# Patient Record
Sex: Female | Born: 1975 | Race: Black or African American | Hispanic: No | Marital: Single
Health system: Southern US, Community
[De-identification: ages and names within clinical notes are randomized; demographics above are authoritative.]

---

## 2015-03-19 ENCOUNTER — Emergency Department (HOSPITAL_COMMUNITY): Payer: Self-pay

## 2015-03-19 ENCOUNTER — Emergency Department (HOSPITAL_COMMUNITY)
Admission: EM | Admit: 2015-03-19 | Discharge: 2015-03-19 | Disposition: A | Discharge status: Home / Self Care | Payer: Self-pay | Attending: Emergency Medicine | Admitting: Emergency Medicine

## 2015-03-19 ENCOUNTER — Encounter (HOSPITAL_COMMUNITY): Payer: Self-pay | Admitting: Radiology

## 2015-03-19 DIAGNOSIS — M79605 Pain in left leg: Secondary | ICD-10-CM | POA: Insufficient documentation

## 2015-03-19 DIAGNOSIS — R569 Unspecified convulsions: Secondary | ICD-10-CM | POA: Insufficient documentation

## 2015-03-19 DIAGNOSIS — R112 Nausea with vomiting, unspecified: Secondary | ICD-10-CM | POA: Insufficient documentation

## 2015-03-19 DIAGNOSIS — Z3202 Encounter for pregnancy test, result negative: Secondary | ICD-10-CM | POA: Insufficient documentation

## 2015-03-19 DIAGNOSIS — R42 Dizziness and giddiness: Secondary | ICD-10-CM | POA: Insufficient documentation

## 2015-03-19 LAB — CBC WITH DIFFERENTIAL/PLATELET
BASOS ABS: 0 10*3/uL (ref 0.0–0.1)
BASOS PCT: 0 %
Eosinophils Absolute: 0.1 10*3/uL (ref 0.0–0.7)
Eosinophils Relative: 1 %
HEMATOCRIT: 37.7 % (ref 36.0–46.0)
HEMOGLOBIN: 12 g/dL (ref 12.0–15.0)
Lymphocytes Relative: 23 %
Lymphs Abs: 2.4 10*3/uL (ref 0.7–4.0)
MCH: 26.3 pg (ref 26.0–34.0)
MCHC: 31.8 g/dL (ref 30.0–36.0)
MCV: 82.5 fL (ref 78.0–100.0)
MONOS PCT: 6 %
Monocytes Absolute: 0.6 10*3/uL (ref 0.1–1.0)
NEUTROS ABS: 7.3 10*3/uL (ref 1.7–7.7)
NEUTROS PCT: 70 %
Platelets: 293 10*3/uL (ref 150–400)
RBC: 4.57 MIL/uL (ref 3.87–5.11)
RDW: 16.7 % — ABNORMAL HIGH (ref 11.5–15.5)
WBC: 10.4 10*3/uL (ref 4.0–10.5)

## 2015-03-19 LAB — RAPID URINE DRUG SCREEN, HOSP PERFORMED
Amphetamines: NOT DETECTED
BARBITURATES: NOT DETECTED
Benzodiazepines: NOT DETECTED
Cocaine: NOT DETECTED
Opiates: NOT DETECTED
Tetrahydrocannabinol: POSITIVE — AB

## 2015-03-19 LAB — COMPREHENSIVE METABOLIC PANEL
ALBUMIN: 4.2 g/dL (ref 3.5–5.0)
ALK PHOS: 70 U/L (ref 38–126)
ALT: 14 U/L (ref 14–54)
AST: 40 U/L (ref 15–41)
Anion gap: 14 (ref 5–15)
BILIRUBIN TOTAL: 1.5 mg/dL — AB (ref 0.3–1.2)
BUN: 10 mg/dL (ref 6–20)
CALCIUM: 9.4 mg/dL (ref 8.9–10.3)
CO2: 21 mmol/L — ABNORMAL LOW (ref 22–32)
Chloride: 104 mmol/L (ref 101–111)
Creatinine, Ser: 0.94 mg/dL (ref 0.44–1.00)
GFR calc Af Amer: >60 mL/min (ref >60)
GFR calc non Af Amer: >60 mL/min (ref >60)
GLUCOSE: 78 mg/dL (ref 65–99)
Potassium: 4.8 mmol/L (ref 3.5–5.1)
Sodium: 139 mmol/L (ref 135–145)
TOTAL PROTEIN: 8.2 g/dL — AB (ref 6.5–8.1)

## 2015-03-19 LAB — PREGNANCY, URINE: Preg Test, Ur: NEGATIVE

## 2015-03-19 LAB — URINE MICROSCOPIC-ADD ON

## 2015-03-19 LAB — I-STAT CG4 LACTIC ACID, ED: LACTIC ACID, VENOUS: 0.93 mmol/L (ref 0.5–2.0)

## 2015-03-19 LAB — D-DIMER, QUANTITATIVE (NOT AT ARMC): D DIMER QUANT: 0.59 ug{FEU}/mL — AB (ref 0.00–0.50)

## 2015-03-19 LAB — URINALYSIS, ROUTINE W REFLEX MICROSCOPIC
Bilirubin Urine: NEGATIVE
Glucose, UA: NEGATIVE mg/dL
KETONES UR: 40 mg/dL — AB
LEUKOCYTES UA: NEGATIVE
Nitrite: NEGATIVE
PROTEIN: NEGATIVE mg/dL
Specific Gravity, Urine: 1.023 (ref 1.005–1.030)
pH: 5.5 (ref 5.0–8.0)

## 2015-03-19 MED ORDER — IOHEXOL 350 MG/ML SOLN
80.0000 mL | Freq: Once | INTRAVENOUS | Status: AC | PRN
  Administered 2015-03-19: 100 mL via INTRAVENOUS

## 2015-03-19 MED ORDER — ACETAMINOPHEN 500 MG PO TABS
1000.0000 mg | ORAL_TABLET | Freq: Once | ORAL | Status: AC
  Administered 2015-03-19: 1000 mg via ORAL
  Filled 2015-03-19: qty 2

## 2015-03-19 NOTE — ED Notes (Signed)
Interpreter called: arrival estimate 20-25 minutes.

## 2015-03-19 NOTE — ED Notes (Signed)
Called for Sign Language Interpreter 

## 2015-03-19 NOTE — Discharge Instructions (Signed)
Do not drive until you are cleared by your primary care physician or neurologist in [place].    Please eat regularly, push fluids drinking water, get rest and follow closely with primary care.   Seizure, Adult A seizure means there is unusual activity in the brain. A seizure can cause changes in attention or behavior. Seizures often cause shaking (convulsions). Seizures often last from 30 seconds to 2 minutes. HOME CARE   If you are given medicines, take them exactly as told by your doctor.  Keep all doctor visits as told.  Do not swim or drive until your doctor says it is okay.  Teach others what to do if you have a seizure. They should:  Lay you on the ground.  Put a cushion under your head.  Loosen any tight clothing around your neck.  Turn you on your side.  Stay with you until you get better. GET HELP RIGHT AWAY IF:   The seizure lasts longer than 2 to 5 minutes.  The seizure is very bad.  The person does not wake up after the seizure.  The person's attention or behavior changes. Drive the person to the emergency room or call your local emergency services (911 in [place] MAKE SURE YOU:   Understand these instructions.  Will watch your condition.  Will get help right away if you are not doing well or get worse.   This information is not intended to replace advice given to you by your health care provider. Make sure you discuss any questions you have with your health care provider.   Document Released: 06/12/2007 Document Revised: 03/18/2011 Document Reviewed: 08/05/2012 Elsevier Interactive Patient Education [employer]2016 [employer]

## 2015-03-19 NOTE — ED Notes (Signed)
Patient transported to CT 

## 2015-03-19 NOTE — ED Notes (Signed)
Interpreter service called for help with interpretation for Mrs Valerie Patel, pt worry about no having interpreter service at all the time while in the hospital.
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
## 2015-03-19 NOTE — ED Notes (Signed)
Through written communication patient reports history of one seizure. Not on medication. Gives permission to obtain blood for labwork.
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
## 2015-03-19 NOTE — ED Notes (Signed)
Interpreter service to be available in 30-45 min.

## 2017-06-01 IMAGING — DX DG FOOT COMPLETE 3+V*L*
3 series · 3 of 3 positions shown · non-contrast
Comparison: None.

CLINICAL DATA: Pain of unknown origin and distal left calf.

EXAM:
LEFT FOOT - COMPLETE 3+ VIEW

[foot ap]
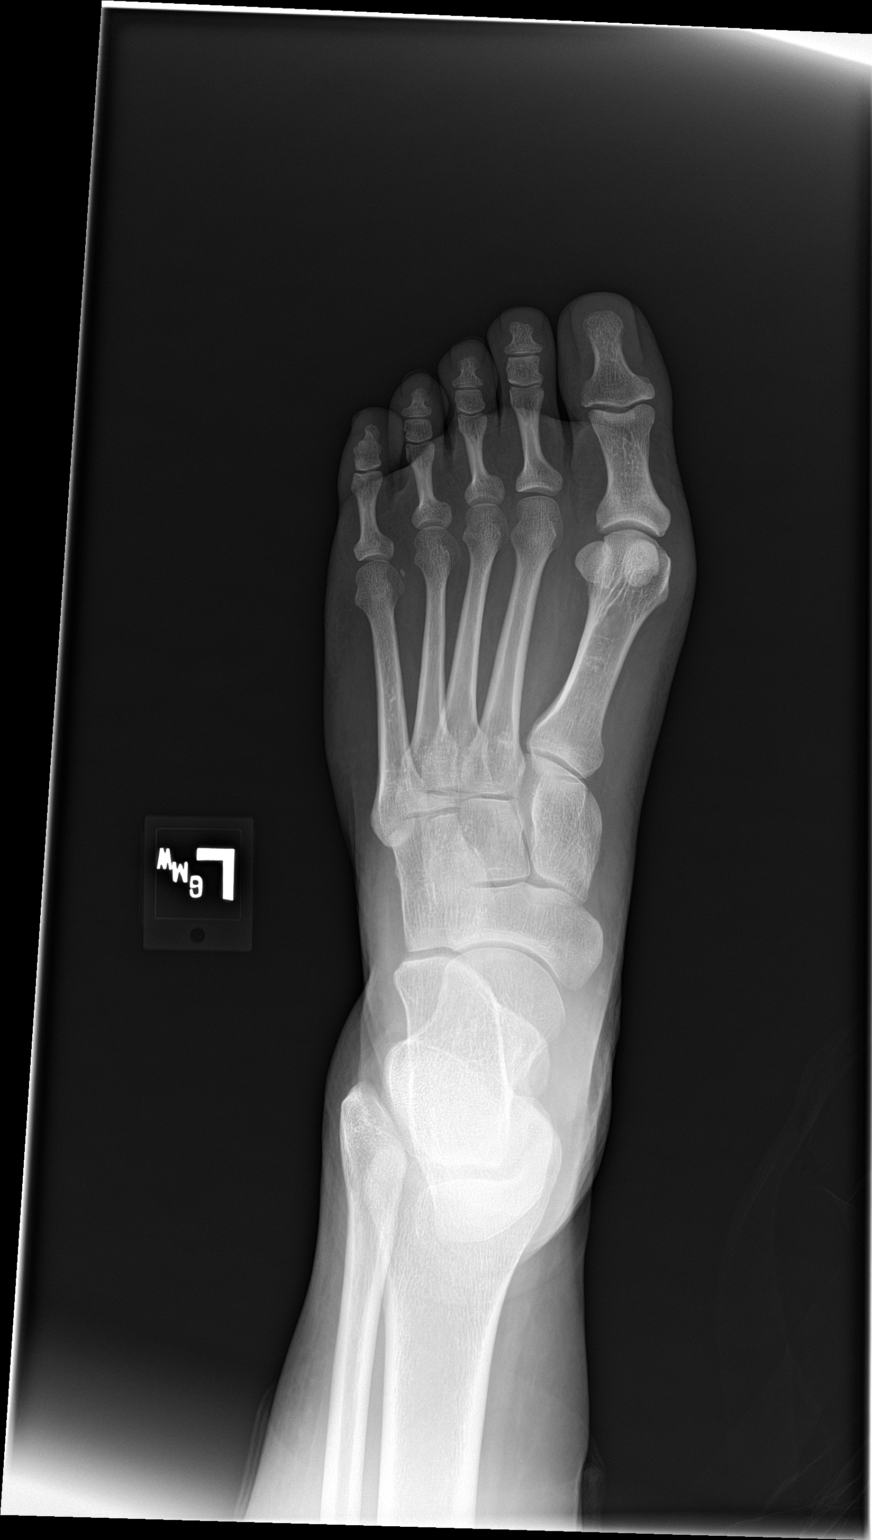

[foot obl]
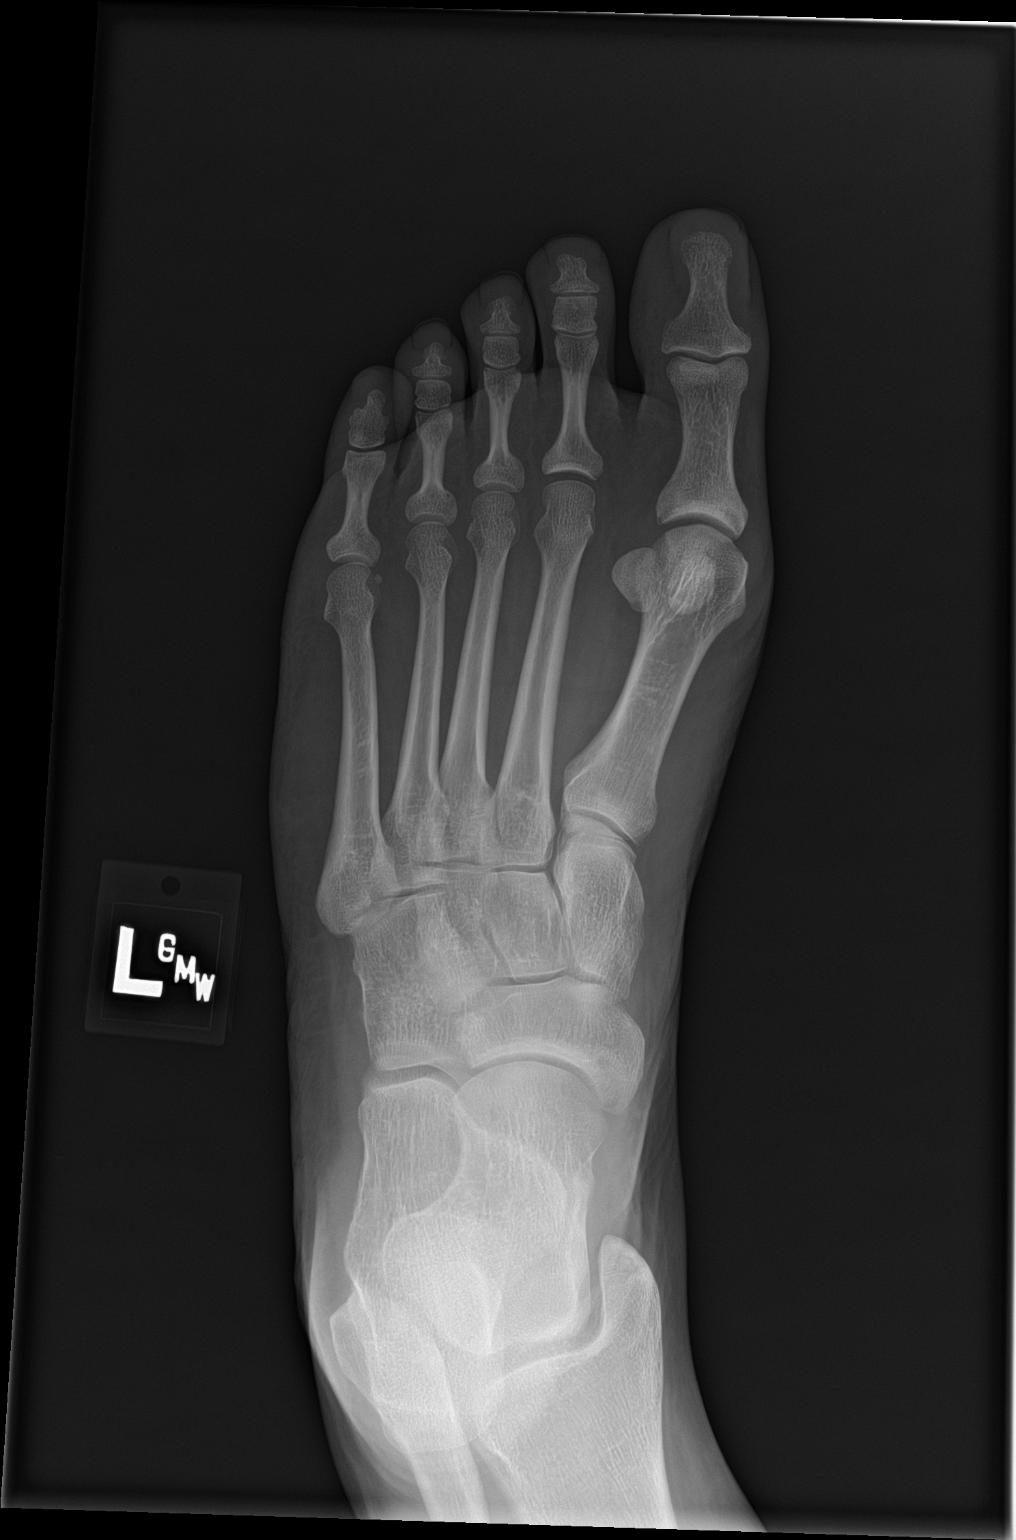

[foot lat]
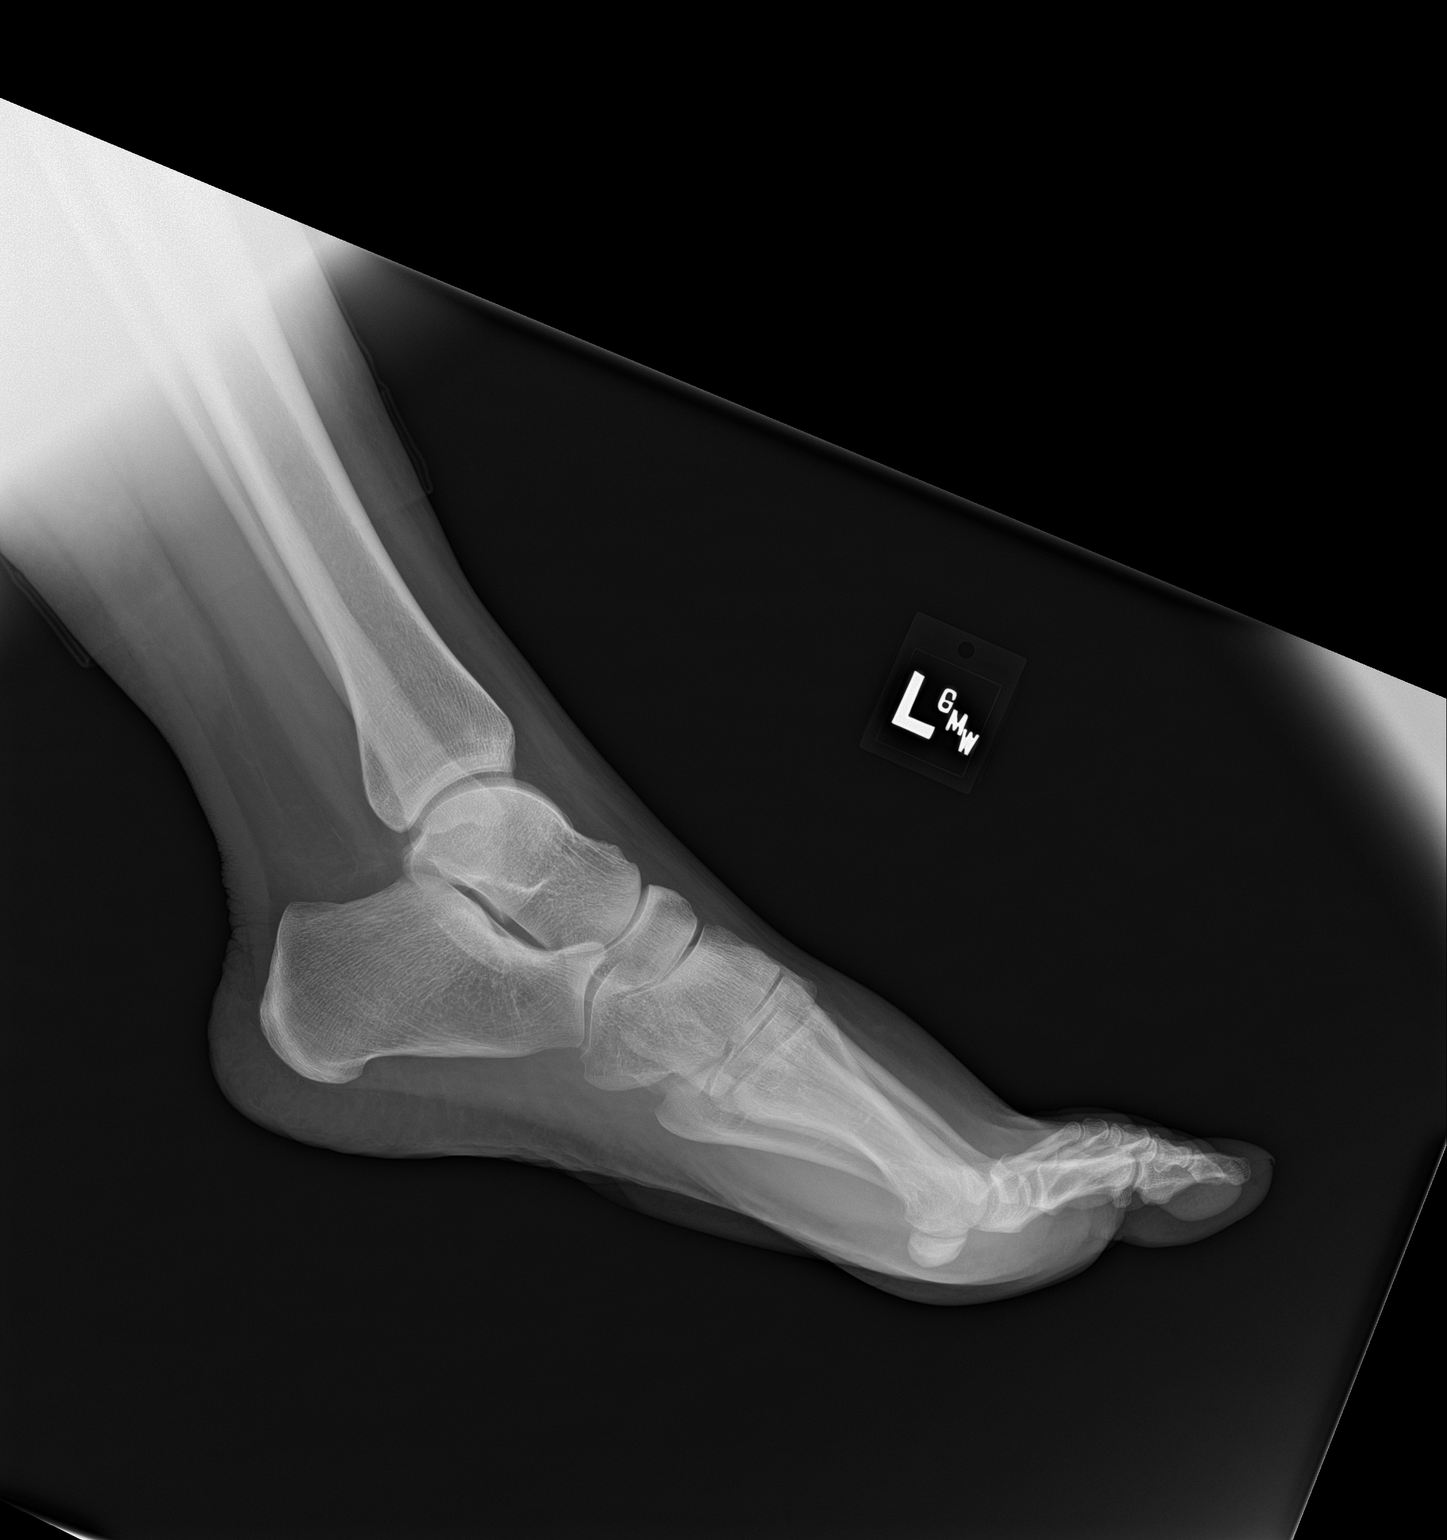

[3 of 3 positions shown; findings below may reference images not displayed]

FINDINGS: There is no evidence of fracture or dislocation. There is no
evidence of arthropathy or other focal bone abnormality. Soft
tissues are unremarkable.
IMPRESSION: Negative.

## 2017-06-01 IMAGING — CT CT ANGIO CHEST
2 of 10 series · 18 of 46 positions shown · IV contrast (APPLIED)
Comparison: None.

CLINICAL DATA: Status post fall in bathroom. Patient shaking.
Question of seizure. Shortness of breath and generalized chest pain,
acute onset. Initial encounter.

EXAM:
CT ANGIOGRAPHY CHEST WITH CONTRAST
TECHNIQUE: Multidetector CT imaging of the chest was performed using the
standard protocol during bolus administration of intravenous
contrast. Multiplanar CT image reconstructions and MIPs were
obtained to evaluate the vascular anatomy.
CONTRAST:  100mL OMNIPAQUE IOHEXOL 350 MG/ML SOLN

[Series 6: thins · axial · 0.78mm/px · z∈[+1145,+1388]mm · 15 of 275 slices shown]
[im 16/275  lung]
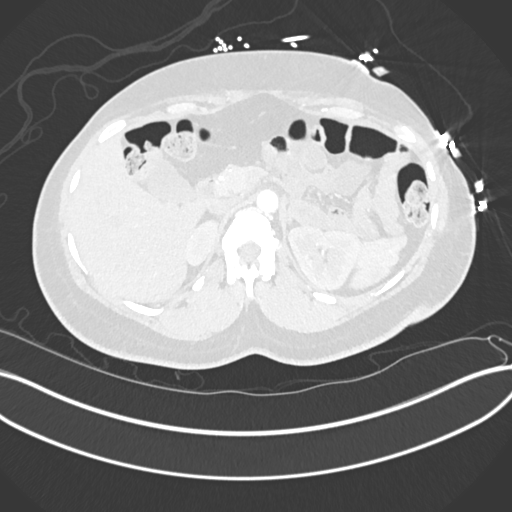
[im 31/275  soft-tissue]
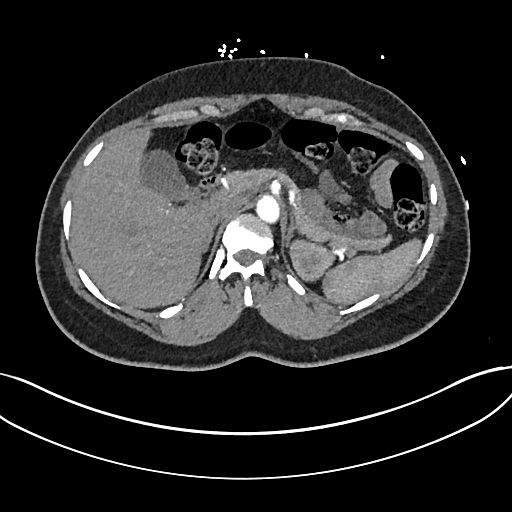
[im 46/275  lung]
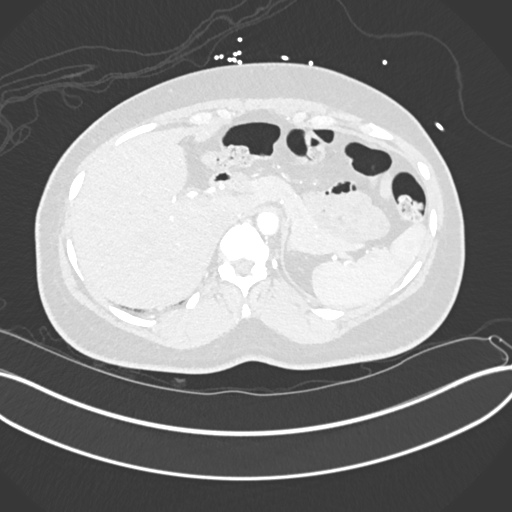
[im 61/275  soft-tissue]
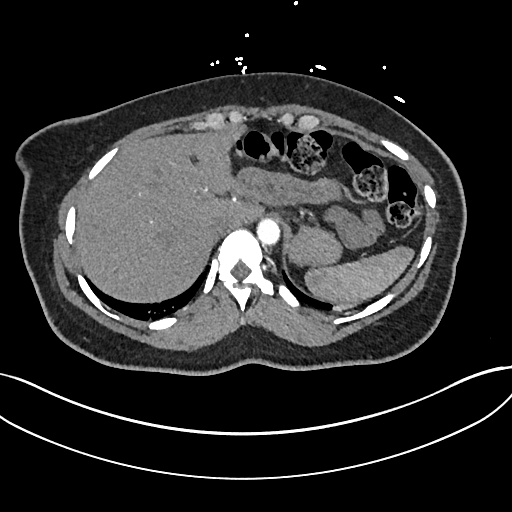
[im 92/275  lung]
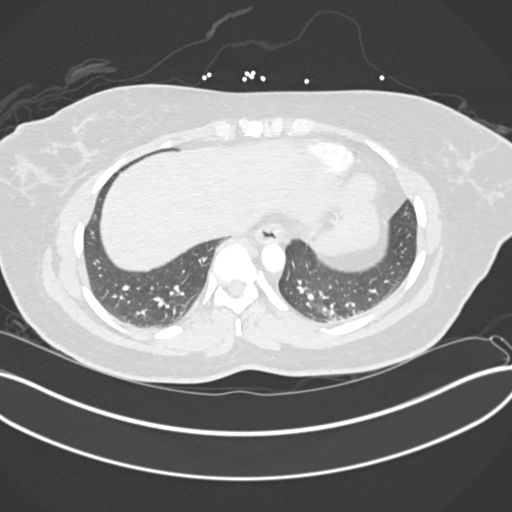
[im 107/275  soft-tissue]
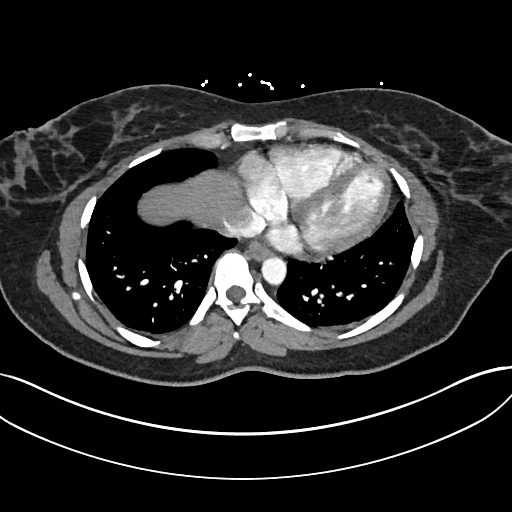
[im 122/275  lung]
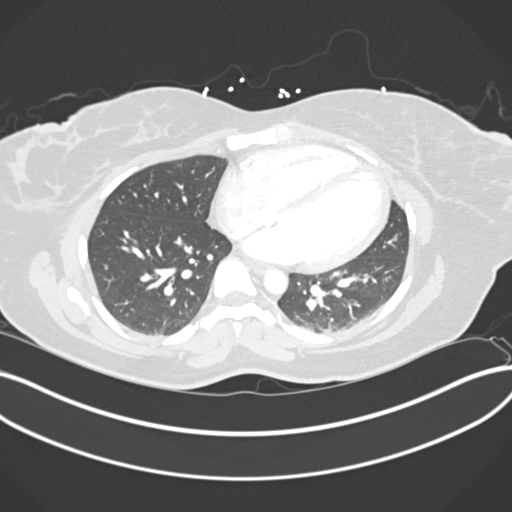
[im 138/275  soft-tissue]
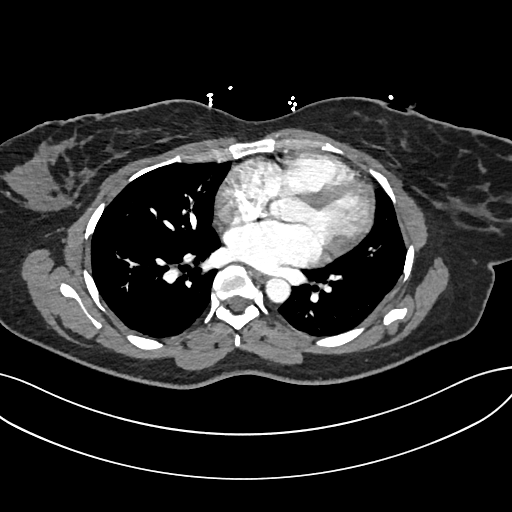
[im 153/275  lung]
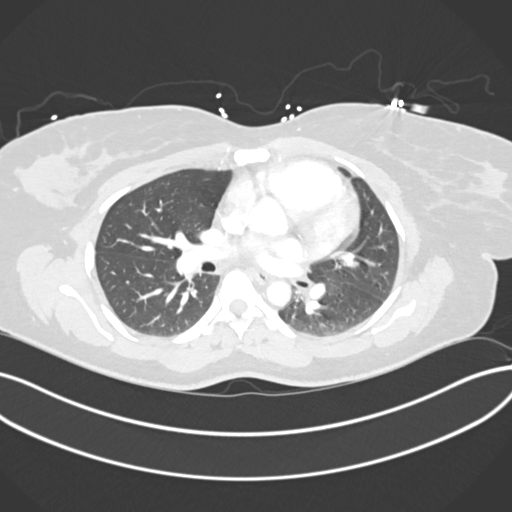
[im 168/275  soft-tissue]
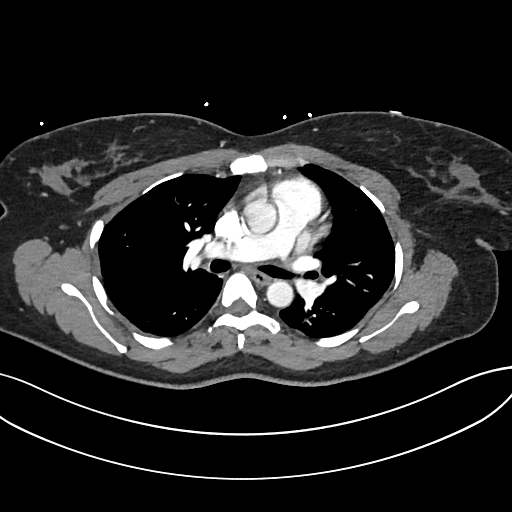
[im 183/275  lung]
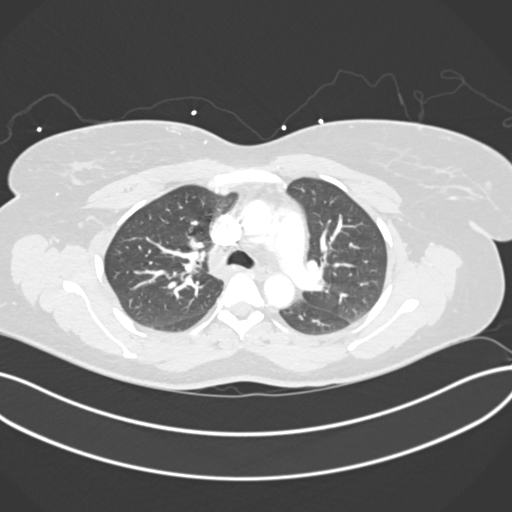
[im 214/275  soft-tissue]
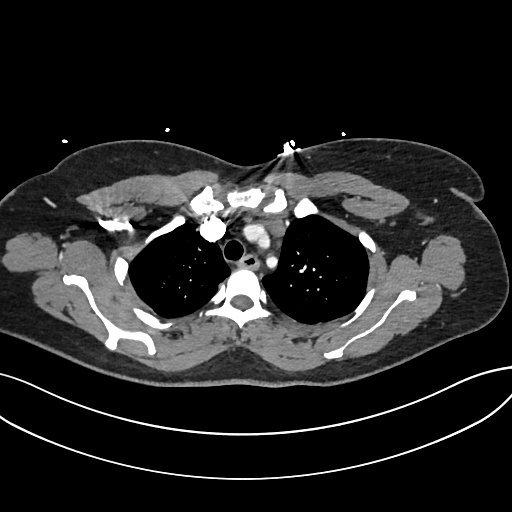
[im 229/275  lung]
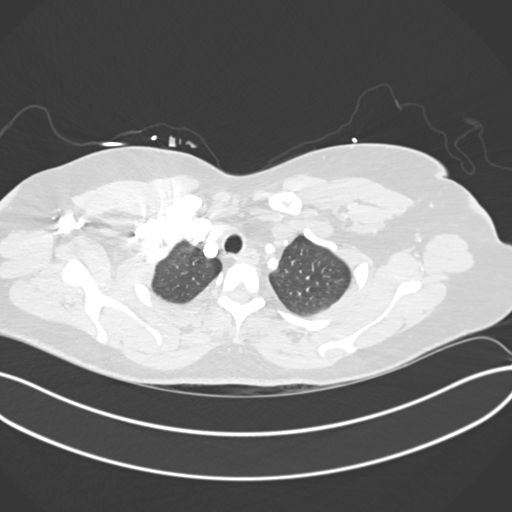
[im 244/275  soft-tissue]
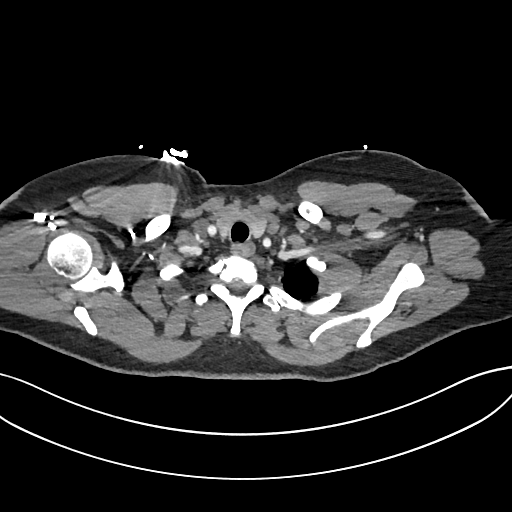
[im 259/275  lung]
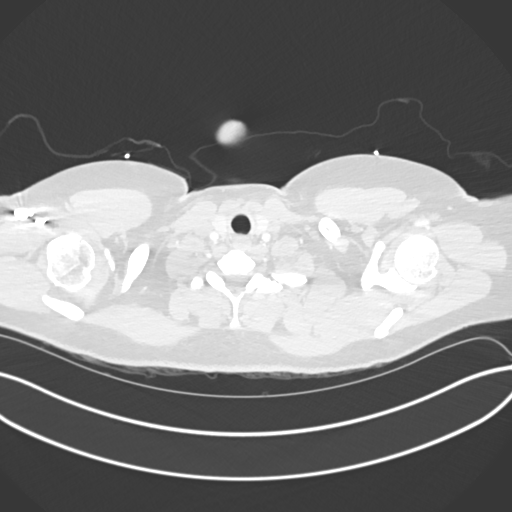

[Series 11: coronal mpr · coronal · 0.54mm/px · 3 of 112 slices shown]
[im 28/112  soft-tissue]
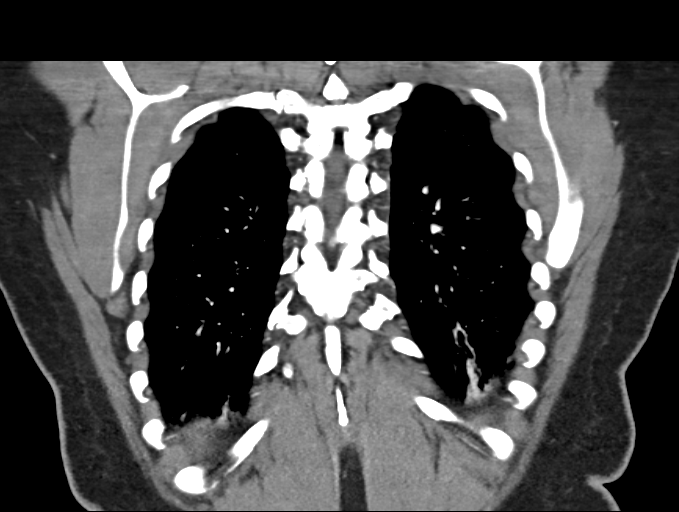
[im 56/112  soft-tissue]
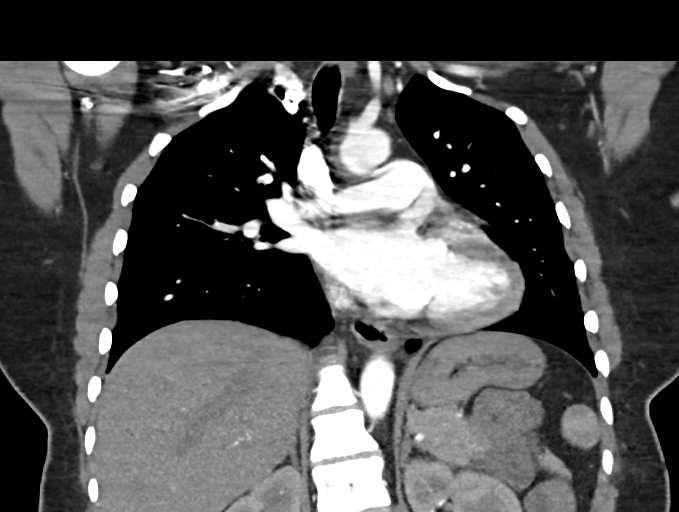
[im 84/112  soft-tissue]
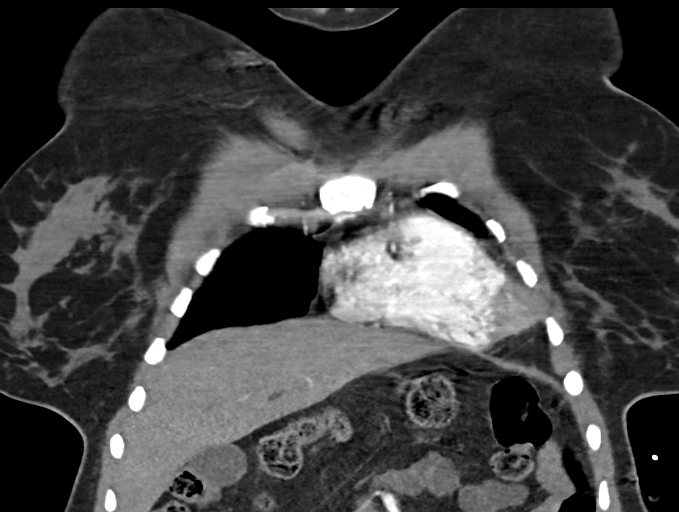

[18 of 46 positions shown; findings below may reference images not displayed]

FINDINGS: There is no evidence of pulmonary embolus.

Minimal bibasilar atelectasis is noted. The lungs are otherwise
clear. There is no evidence of significant focal consolidation,
pleural effusion or pneumothorax. No masses are identified; no
abnormal focal contrast enhancement is seen.

The mediastinum is unremarkable in appearance. No mediastinal
lymphadenopathy is seen. No pericardial effusion is identified. The
great vessels are grossly unremarkable in appearance. No axillary
lymphadenopathy is seen. The thyroid gland is unremarkable in
appearance.

The visualized portions of the liver and spleen are unremarkable.
The visualized portions of the pancreas, gallbladder, stomach,
adrenal glands and kidneys are within normal limits.

No acute osseous abnormalities are seen.

Review of the MIP images confirms the above findings.
IMPRESSION: 1. No evidence of pulmonary embolus.
2. Minimal bibasilar atelectasis noted.  Lungs otherwise clear.
